# Patient Record
Sex: Male | Born: 1985 | Race: Black or African American | Hispanic: No | Marital: Single | Smoking: Current every day smoker
Health system: Southern US, Community
[De-identification: ages and names within clinical notes are randomized; demographics above are authoritative.]

## PROBLEM LIST (undated history)

## (undated) DIAGNOSIS — N200 Calculus of kidney: Secondary | ICD-10-CM

---

## 2013-06-01 ENCOUNTER — Emergency Department (HOSPITAL_COMMUNITY): Payer: Self-pay

## 2013-06-01 ENCOUNTER — Emergency Department (HOSPITAL_COMMUNITY)
Admission: EM | Admit: 2013-06-01 | Discharge: 2013-06-01 | Disposition: A | Discharge status: Home / Self Care | Payer: Self-pay | Attending: Emergency Medicine | Admitting: Emergency Medicine

## 2013-06-01 ENCOUNTER — Encounter (HOSPITAL_COMMUNITY): Payer: Self-pay | Admitting: Emergency Medicine

## 2013-06-01 DIAGNOSIS — R110 Nausea: Secondary | ICD-10-CM | POA: Insufficient documentation

## 2013-06-01 DIAGNOSIS — R319 Hematuria, unspecified: Secondary | ICD-10-CM

## 2013-06-01 DIAGNOSIS — R61 Generalized hyperhidrosis: Secondary | ICD-10-CM | POA: Insufficient documentation

## 2013-06-01 DIAGNOSIS — R310 Gross hematuria: Secondary | ICD-10-CM | POA: Insufficient documentation

## 2013-06-01 DIAGNOSIS — F17200 Nicotine dependence, unspecified, uncomplicated: Secondary | ICD-10-CM | POA: Insufficient documentation

## 2013-06-01 DIAGNOSIS — R209 Unspecified disturbances of skin sensation: Secondary | ICD-10-CM | POA: Insufficient documentation

## 2013-06-01 DIAGNOSIS — N200 Calculus of kidney: Secondary | ICD-10-CM | POA: Insufficient documentation

## 2013-06-01 HISTORY — DX: Calculus of kidney: N20.0

## 2013-06-01 LAB — BASIC METABOLIC PANEL
BUN: 11 mg/dL (ref 6–23)
CO2: 23 mEq/L (ref 19–32)
Calcium: 9 mg/dL (ref 8.4–10.5)
Chloride: 99 mEq/L (ref 96–112)
Creatinine, Ser: 1.07 mg/dL (ref 0.50–1.35)
GFR calc non Af Amer: >90 mL/min (ref >90)
Glucose, Bld: 92 mg/dL (ref 70–99)
POTASSIUM: 3.9 meq/L (ref 3.7–5.3)
SODIUM: 136 meq/L — AB (ref 137–147)

## 2013-06-01 LAB — URINE MICROSCOPIC-ADD ON

## 2013-06-01 LAB — CBC WITH DIFFERENTIAL/PLATELET
BASOS ABS: 0 10*3/uL (ref 0.0–0.1)
BASOS PCT: 0 % (ref 0–1)
Eosinophils Absolute: 0 10*3/uL (ref 0.0–0.7)
Eosinophils Relative: 0 % (ref 0–5)
HCT: 41.5 % (ref 39.0–52.0)
HEMOGLOBIN: 14.5 g/dL (ref 13.0–17.0)
Lymphocytes Relative: 6 % — ABNORMAL LOW (ref 12–46)
Lymphs Abs: 1.3 10*3/uL (ref 0.7–4.0)
MCH: 29.5 pg (ref 26.0–34.0)
MCHC: 34.9 g/dL (ref 30.0–36.0)
MCV: 84.3 fL (ref 78.0–100.0)
Monocytes Absolute: 1.3 10*3/uL — ABNORMAL HIGH (ref 0.1–1.0)
Monocytes Relative: 6 % (ref 3–12)
NEUTROS ABS: 20.2 10*3/uL — AB (ref 1.7–7.7)
NEUTROS PCT: 89 % — AB (ref 43–77)
Platelets: 187 10*3/uL (ref 150–400)
RBC: 4.92 MIL/uL (ref 4.22–5.81)
RDW: 15.1 % (ref 11.5–15.5)
WBC: 22.9 10*3/uL — AB (ref 4.0–10.5)

## 2013-06-01 LAB — URINALYSIS, ROUTINE W REFLEX MICROSCOPIC
Bilirubin Urine: NEGATIVE
GLUCOSE, UA: NEGATIVE mg/dL
KETONES UR: NEGATIVE mg/dL
LEUKOCYTES UA: NEGATIVE
NITRITE: NEGATIVE
PH: 8 (ref 5.0–8.0)
Protein, ur: 30 mg/dL — AB
SPECIFIC GRAVITY, URINE: 1.016 (ref 1.005–1.030)
Urobilinogen, UA: 0.2 mg/dL (ref 0.0–1.0)

## 2013-06-01 MED ORDER — KETOROLAC TROMETHAMINE 30 MG/ML IJ SOLN
30.0000 mg | Freq: Once | INTRAMUSCULAR | Status: AC
  Administered 2013-06-01: 30 mg via INTRAVENOUS
  Filled 2013-06-01: qty 1

## 2013-06-01 MED ORDER — TAMSULOSIN HCL 0.4 MG PO CAPS: 0.4000 mg | ORAL_CAPSULE | Freq: Every day | ORAL | Status: AC

## 2013-06-01 MED ORDER — ONDANSETRON 4 MG PO TBDP: 4.0000 mg | ORAL_TABLET | Freq: Three times a day (TID) | ORAL | Status: AC | PRN

## 2013-06-01 MED ORDER — OXYCODONE-ACETAMINOPHEN 5-325 MG PO TABS: 1.0000 | ORAL_TABLET | ORAL | Status: AC | PRN

## 2013-06-01 MED ORDER — HYDROMORPHONE HCL PF 1 MG/ML IJ SOLN
1.0000 mg | INTRAMUSCULAR | Status: AC
  Administered 2013-06-01: 1 mg via INTRAVENOUS
  Filled 2013-06-01: qty 1

## 2013-06-01 MED ORDER — ONDANSETRON HCL 4 MG/2ML IJ SOLN
4.0000 mg | Freq: Once | INTRAMUSCULAR | Status: AC
  Administered 2013-06-01: 4 mg via INTRAVENOUS
  Filled 2013-06-01: qty 2

## 2013-06-01 MED ORDER — HYDROMORPHONE HCL PF 1 MG/ML IJ SOLN
1.0000 mg | Freq: Once | INTRAMUSCULAR | Status: AC
  Administered 2013-06-01: 1 mg via INTRAVENOUS
  Filled 2013-06-01: qty 1

## 2013-06-01 MED ORDER — METOCLOPRAMIDE HCL 5 MG/ML IJ SOLN
10.0000 mg | INTRAMUSCULAR | Status: AC
  Administered 2013-06-01: 10 mg via INTRAVENOUS
  Filled 2013-06-01: qty 2

## 2013-06-01 NOTE — ED Notes (Signed)
Patient getting dressed.

## 2013-06-01 NOTE — ED Notes (Signed)
IV unable to pull blood samples

## 2013-06-01 NOTE — ED Notes (Signed)
Patient at CT, CT called, patient vomiting. Requested more nausea medication and pain medication, orders recieved

## 2013-06-01 NOTE — Progress Notes (Signed)
Patient vomited approximately 100 ml yellow liquid on arrival to CT

## 2013-06-01 NOTE — Discharge Instructions (Signed)
Take the prescribed medication as directed.  Strain all urine to monitor for passage of stone.   Follow-up with [facility] if problems occur. Return to the ED for new or worsening symptoms.

## 2013-06-01 NOTE — ED Notes (Signed)
Patient able to complete scan by the time medications were brought to him, patient in room now, still in pain.

## 2013-06-01 NOTE — ED Notes (Signed)
Right side flank pain since 0500 today, hx of kidney stones

## 2013-06-01 NOTE — ED Provider Notes (Signed)
CSN: [id number]     Arrival date & time 06/01/13  0847 History   First MD Initiated Contact with Patient 06/01/13 [phone]     Chief Complaint  Patient presents with  . Flank Pain   (Consider location/radiation/quality/duration/timing/severity/associated sxs/prior Treatment) The history is provided by the patient and medical records.   This is a 28 year old male with past medical history significant for kidney stones, presenting to the ED for right flank pain, nausea, and vomiting. Pain is described as sharp, with some radiation to the right side of his groin. No pain in the testicle. Patient denies any urinary symptoms or urethral discharge.  No fever, sweats, or chills. Patient states last episode of kidney stones is approximately 6 months ago. He does not follow with urology regularly.  Vital signs stable on arrival.  No past medical history on file. No past surgical history on file. No family history on file. History  Substance Use Topics  . Smoking status: Not on file  . Smokeless tobacco: Not on file  . Alcohol Use: Not on file    Review of Systems  Gastrointestinal: Positive for nausea and vomiting.  Genitourinary: Positive for flank pain.  All other systems reviewed and are negative.    Allergies  Review of patient's allergies indicates no known allergies.  Home Medications   Current Outpatient Rx  Name  Route  Sig  Dispense  Refill  . acetaminophen (TYLENOL) 500 MG tablet   Oral   Take 500 mg by mouth every 6 (six) hours as needed (pain).          BP 159/101  Pulse 74  Temp(Src) 98.3 F (36.8 C) (Oral)  Resp 21  SpO2 100%  Physical Exam  Nursing note and vitals reviewed. Constitutional: He is oriented to person, place, and time. He appears well-developed and well-nourished. No distress.  Diaphoretic, actively vomiting multiple times  HENT:  Head: Normocephalic and atraumatic.  Mouth/Throat: Oropharynx is clear and moist.  Eyes: Conjunctivae and EOM are  normal. Pupils are equal, round, and reactive to light.  Neck: Normal range of motion. Neck supple.  Cardiovascular: Normal rate, regular rhythm and normal heart sounds.   Pulmonary/Chest: Breath sounds normal. No respiratory distress. He has no wheezes.  Abdominal: Soft. Bowel sounds are normal. There is no tenderness. There is CVA tenderness. There is no guarding.  Right CVA tenderness  Musculoskeletal: Normal range of motion.  Neurological: He is alert and oriented to person, place, and time.  Skin: Skin is warm. He is diaphoretic.  Psychiatric: He has a normal mood and affect.    ED Course  Procedures (including critical care time) Labs Review Labs Reviewed  CBC WITH DIFFERENTIAL - Abnormal; Notable for the following:    WBC 22.9 (*)    Neutrophils Relative % 89 (*)    Neutro Abs 20.2 (*)    Lymphocytes Relative 6 (*)    Monocytes Absolute 1.3 (*)    All other components within normal limits  BASIC METABOLIC PANEL - Abnormal; Notable for the following:    Sodium 136 (*)    All other components within normal limits  URINALYSIS, ROUTINE W REFLEX MICROSCOPIC - Abnormal; Notable for the following:    APPearance CLOUDY (*)    Hgb urine dipstick LARGE (*)    Protein, ur 30 (*)    All other components within normal limits  URINE MICROSCOPIC-ADD ON   Imaging Review Ct Abdomen Pelvis Wo Contrast  06/01/2013   CLINICAL DATA:  Right flank  pain  EXAM: CT ABDOMEN AND PELVIS WITHOUT CONTRAST  TECHNIQUE: Multidetector CT imaging of the abdomen and pelvis was performed following the standard protocol without intravenous contrast.  COMPARISON:  None.  FINDINGS: Lung bases are free of acute infiltrate or sizable effusion.  The liver, gallbladder, spleen, adrenal glands and pancreas are within normal limits. The kidneys are well visualized bilaterally and demonstrate multiple nonobstructing bilateral renal stones. The largest of these measures approximately 4-5 mm. The collecting system on the  left is within normal limits. The collecting system on the right shows fullness with prominence of the right ureter. A 4 mm distal right ureteral stone is noted best seen on image number 70. This is consistent with the obstructive changes on the right.  The appendix is within normal limits. The bladder is decompressed. No pelvic mass lesion is seen. The osseous structures are within normal limits.  IMPRESSION: Bilateral nonobstructing renal calculi.  4 mm distal right ureteral stone with obstructive changes.   Electronically Signed   By: Alcide Clever M.D.   On: 06/01/2013 10:12    EKG Interpretation   None       MDM   1. Kidney stone on right side   2. Hematuria    U/a with large blood but non-infectious.  Labs with leukocytosis at 22.9, no electrolyte imbalance.  CT revealing a 4 mm ureteral stone with mild hydronephrosis.  Patient given multiple doses of pain medications and anti-medics and the ED with good improvement of his symptoms.  He will be discharged with percocet, zofran, and flomax.  He will strain all urine to monitor for passage of stone.  FU with urology if problems occur.  Discussed plan with pt, he acknowledged understanding and agreed.  Return precautions advised for new or worsening symptoms.  Garlon Hatchet, PA-C 06/01/13 1514

## 2013-06-03 NOTE — ED Provider Notes (Signed)
Medical screening examination/treatment/procedure(s) were performed by non-physician practitioner and as supervising physician I was immediately available for consultation/collaboration.    Romolo Sieling R Trustin Chapa, MD 06/03/13 1438 

## 2015-06-29 IMAGING — CT CT ABD-PELV W/O CM
2 of 4 series · 17 of 46 positions shown, 19 images · non-contrast
Comparison: None.

CLINICAL DATA: Right flank pain

EXAM:
CT ABDOMEN AND PELVIS WITHOUT CONTRAST
TECHNIQUE: Multidetector CT imaging of the abdomen and pelvis was performed
following the standard protocol without intravenous contrast.

[Series 2: stone study · axial · 0.59mm/px · z∈[+138,+548]mm · 14 of 90 slices shown, 16 images]
[im 4/90  soft-tissue]
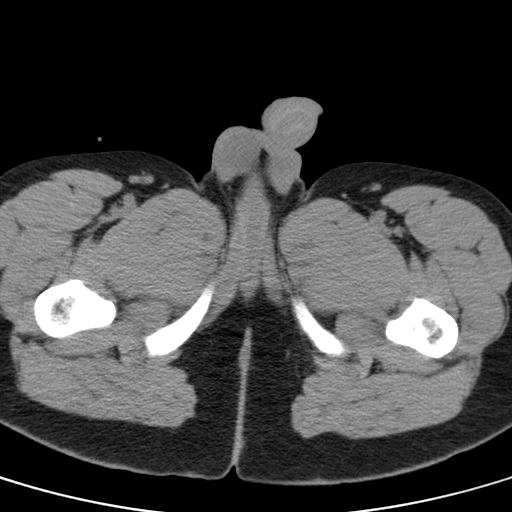
[im 4/90  bone]
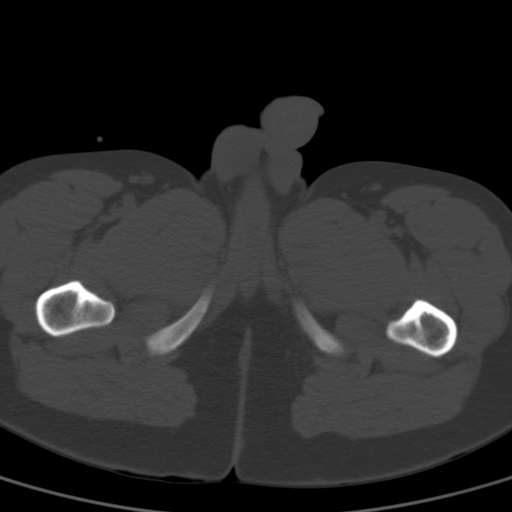
[im 12/90  soft-tissue]
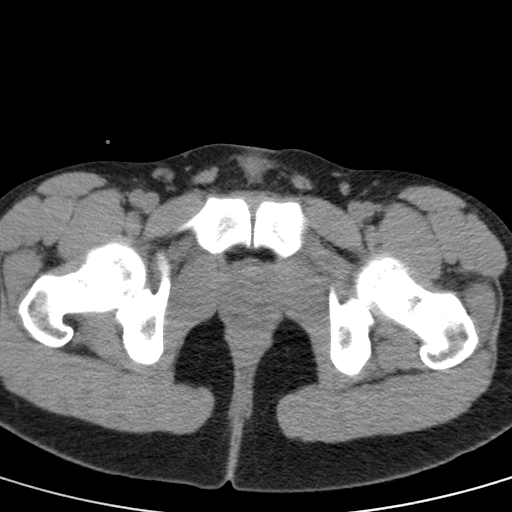
[im 16/90  soft-tissue]
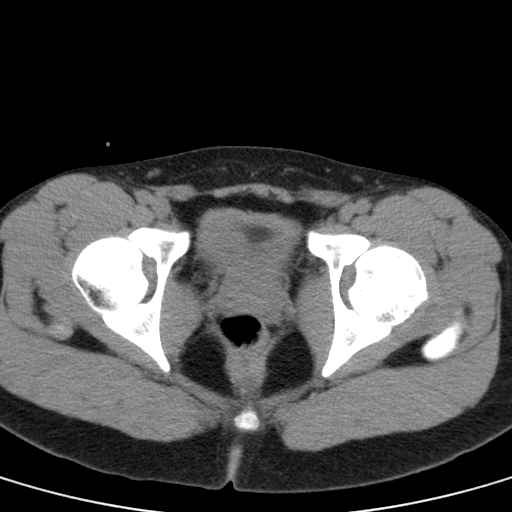
[im 24/90  soft-tissue]
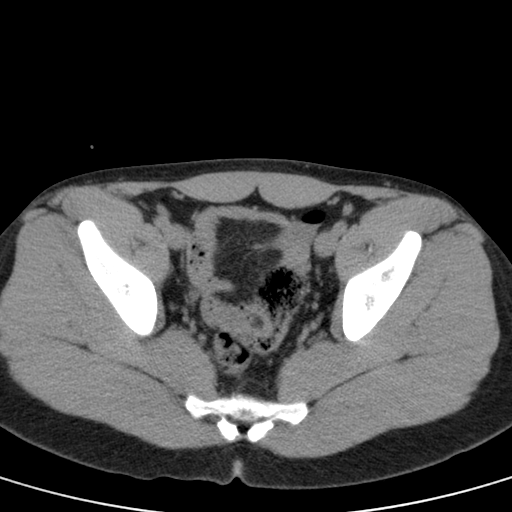
[im 31/90  soft-tissue]
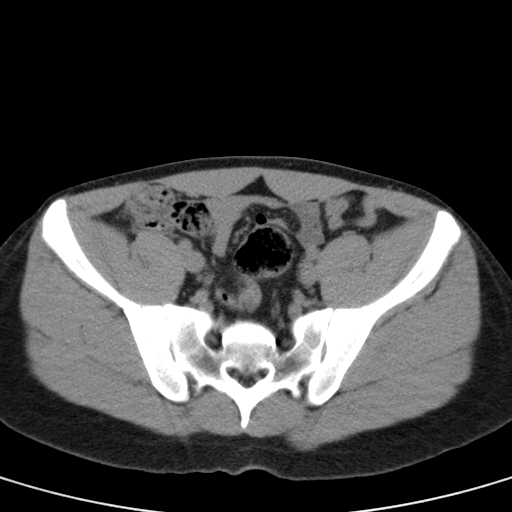
[im 35/90  soft-tissue]
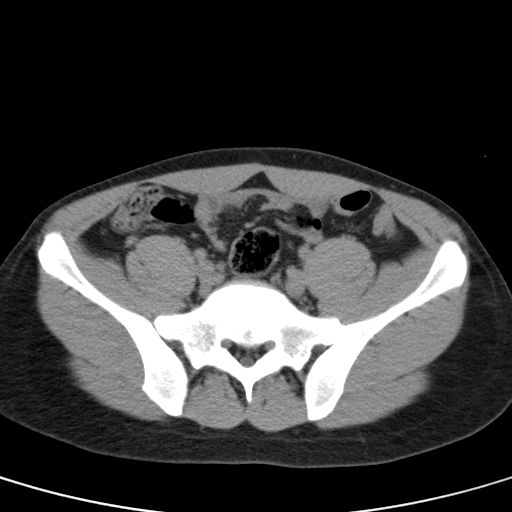
[im 43/90  soft-tissue]
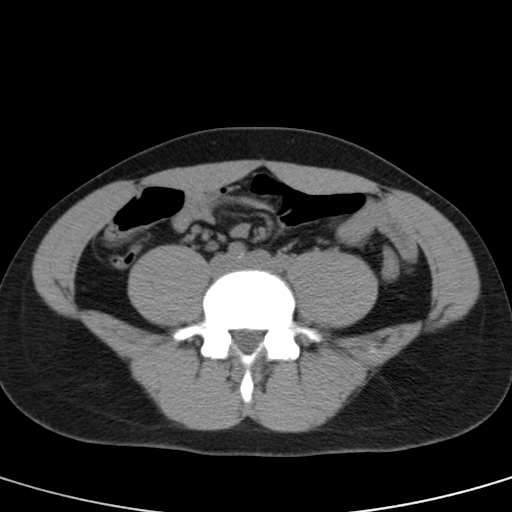
[im 47/90  soft-tissue]
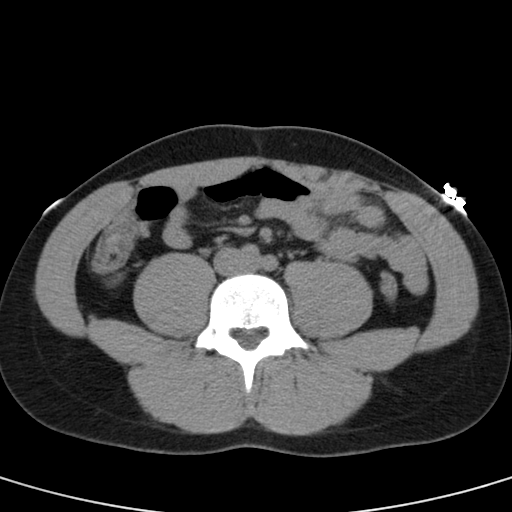
[im 55/90  soft-tissue]
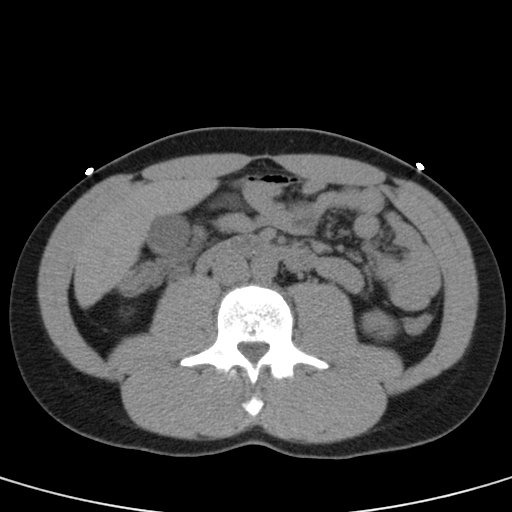
[im 55/90  bone]
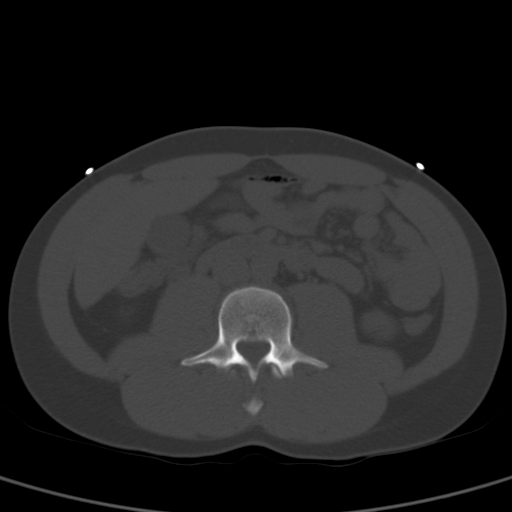
[im 59/90  soft-tissue]
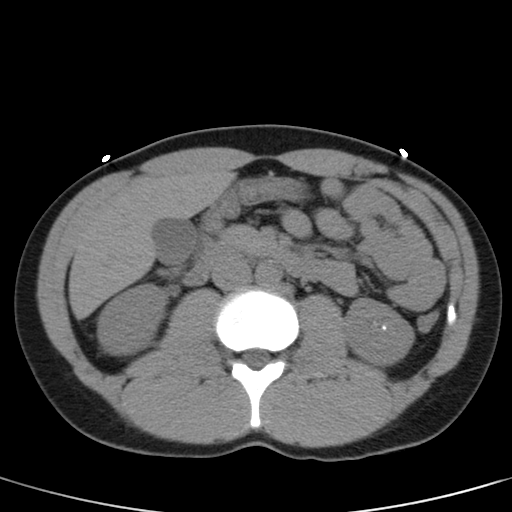
[im 66/90  soft-tissue]
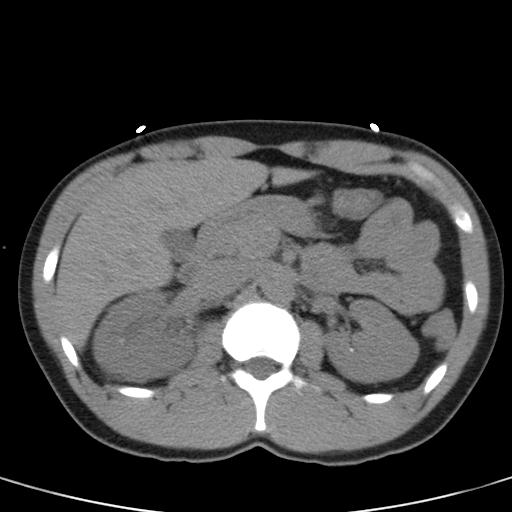
[im 74/90  soft-tissue]
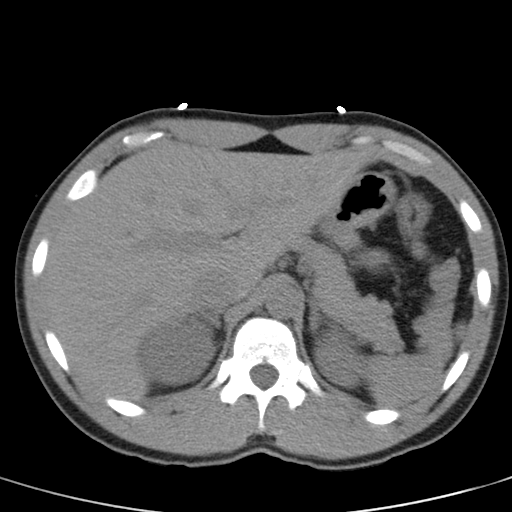
[im 78/90  soft-tissue]
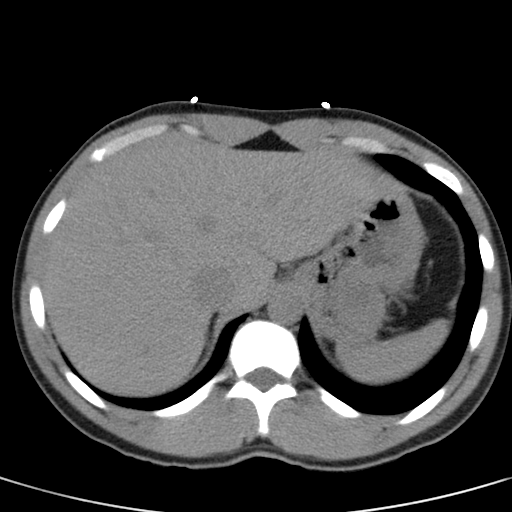
[im 86/90  soft-tissue]
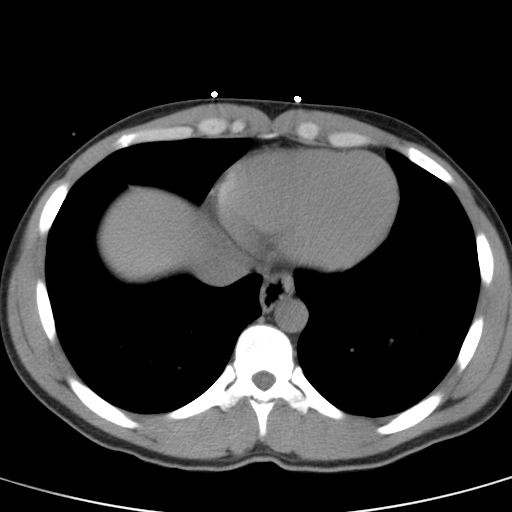

[mpr, coronals, coronal · coronal · 0.87mm/px · 3 of 75 slices shown]
[im 25/75  soft-tissue]
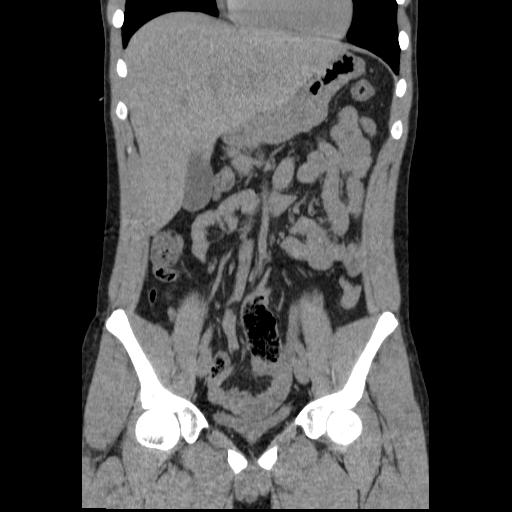
[im 33/75  soft-tissue]
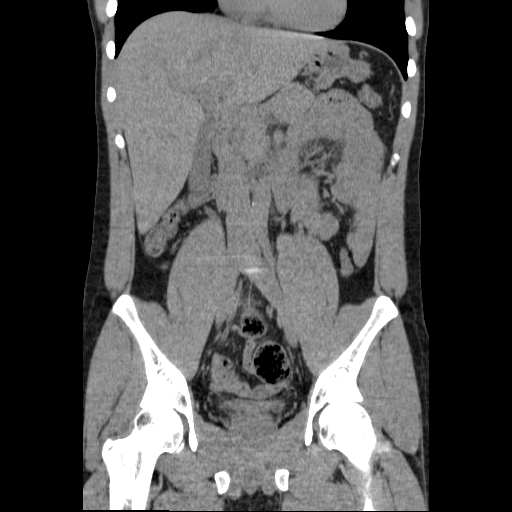
[im 42/75  soft-tissue]
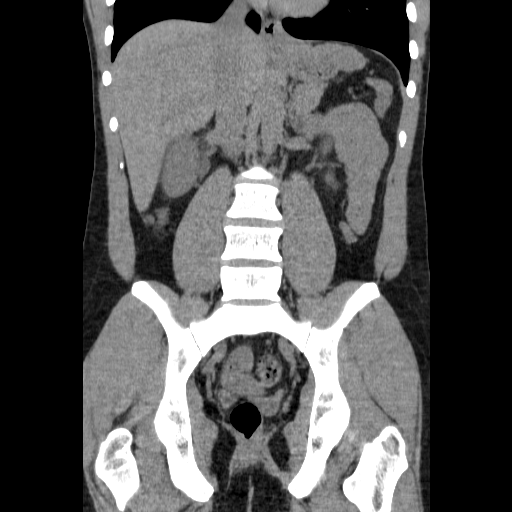

[17 of 46 positions shown; findings below may reference images not displayed]

FINDINGS: Lung bases are free of acute infiltrate or sizable effusion.

The liver, gallbladder, spleen, adrenal glands and pancreas are
within normal limits. The kidneys are well visualized bilaterally
and demonstrate multiple nonobstructing bilateral renal stones. The
largest of these measures approximately 4-5 mm. The collecting
system on the left is within normal limits. The collecting system on
the right shows fullness with prominence of the right ureter. A 4 mm
distal right ureteral stone is noted best seen on image number 70.
This is consistent with the obstructive changes on the right.

The appendix is within normal limits. The bladder is decompressed.
No pelvic mass lesion is seen. The osseous structures are within
normal limits.
IMPRESSION: Bilateral nonobstructing renal calculi.

4 mm distal right ureteral stone with obstructive changes.
